# Patient Record
Sex: Male | Born: 1990
Health system: Southern US, Community
[De-identification: ages and names within clinical notes are randomized; demographics above are authoritative.]

## PROBLEM LIST (undated history)

## (undated) DIAGNOSIS — E7800 Pure hypercholesterolemia, unspecified: Secondary | ICD-10-CM

---

## 2016-08-17 ENCOUNTER — Emergency Department (HOSPITAL_COMMUNITY): Admission: EM | Admit: 2016-08-17 | Discharge: 2016-08-17 | Discharge status: Absent without leave | Payer: Self-pay

## 2016-09-11 ENCOUNTER — Emergency Department (HOSPITAL_COMMUNITY): Payer: Self-pay

## 2016-09-11 ENCOUNTER — Encounter (HOSPITAL_COMMUNITY): Payer: Self-pay | Admitting: Emergency Medicine

## 2016-09-11 ENCOUNTER — Emergency Department (HOSPITAL_COMMUNITY)
Admission: EM | Admit: 2016-09-11 | Discharge: 2016-09-12 | Disposition: A | Discharge status: Home / Self Care | Payer: Self-pay | Attending: Emergency Medicine | Admitting: Emergency Medicine

## 2016-09-11 DIAGNOSIS — R05 Cough: Secondary | ICD-10-CM

## 2016-09-11 DIAGNOSIS — R059 Cough, unspecified: Secondary | ICD-10-CM

## 2016-09-11 DIAGNOSIS — R0602 Shortness of breath: Secondary | ICD-10-CM

## 2016-09-11 DIAGNOSIS — J40 Bronchitis, not specified as acute or chronic: Secondary | ICD-10-CM | POA: Insufficient documentation

## 2016-09-11 HISTORY — DX: Pure hypercholesterolemia, unspecified: E78.00

## 2016-09-11 LAB — CBC WITH DIFFERENTIAL/PLATELET
BASOS PCT: 0 %
Basophils Absolute: 0 10*3/uL (ref 0.0–0.1)
EOS ABS: 0.7 10*3/uL (ref 0.0–0.7)
EOS PCT: 12 %
HEMATOCRIT: 42.3 % (ref 39.0–52.0)
Hemoglobin: 14.1 g/dL (ref 13.0–17.0)
Lymphocytes Relative: 34 %
Lymphs Abs: 2.1 10*3/uL (ref 0.7–4.0)
MCH: 28.3 pg (ref 26.0–34.0)
MCHC: 33.3 g/dL (ref 30.0–36.0)
MCV: 84.9 fL (ref 78.0–100.0)
MONO ABS: 0.7 10*3/uL (ref 0.1–1.0)
MONOS PCT: 11 %
Neutro Abs: 2.7 10*3/uL (ref 1.7–7.7)
Neutrophils Relative %: 43 %
Platelets: 209 10*3/uL (ref 150–400)
RBC: 4.98 MIL/uL (ref 4.22–5.81)
RDW: 13 % (ref 11.5–15.5)
WBC: 6.3 10*3/uL (ref 4.0–10.5)

## 2016-09-11 LAB — BASIC METABOLIC PANEL
Anion gap: 5 (ref 5–15)
BUN: 15 mg/dL (ref 6–20)
CALCIUM: 9 mg/dL (ref 8.9–10.3)
CO2: 28 mmol/L (ref 22–32)
CREATININE: 1.03 mg/dL (ref 0.61–1.24)
Chloride: 105 mmol/L (ref 101–111)
GFR calc Af Amer: >60 mL/min (ref >60)
GFR calc non Af Amer: >60 mL/min (ref >60)
GLUCOSE: 101 mg/dL — AB (ref 65–99)
Potassium: 3.6 mmol/L (ref 3.5–5.1)
Sodium: 138 mmol/L (ref 135–145)

## 2016-09-11 MED ORDER — IPRATROPIUM-ALBUTEROL 0.5-2.5 (3) MG/3ML IN SOLN
3.0000 mL | Freq: Once | RESPIRATORY_TRACT | Status: AC
Start: 2016-09-11 — End: 2016-09-11
  Administered 2016-09-11: 3 mL via RESPIRATORY_TRACT
  Filled 2016-09-11: qty 3

## 2016-09-11 MED ORDER — ALBUTEROL SULFATE HFA 108 (90 BASE) MCG/ACT IN AERS
2.0000 | INHALATION_SPRAY | Freq: Four times a day (QID) | RESPIRATORY_TRACT | Status: DC | PRN
  Administered 2016-09-11: 2 via RESPIRATORY_TRACT
  Filled 2016-09-11: qty 6.7

## 2016-09-11 MED ORDER — BENZONATATE 100 MG PO CAPS: 100.0000 mg | ORAL_CAPSULE | Freq: Three times a day (TID) | ORAL | 0 refills | Status: AC | PRN

## 2016-09-11 NOTE — ED Triage Notes (Signed)
Pt states just got over cold recently and is c/o cough. States there was productive cough with yellow sputum but now is a dry cough. States trouble breathing while laying down. Pt speaking in full sentences. Lung sounds are clear. Pt reports taking Nyquil before coming. Pt states trouble while sleeping.

## 2016-09-11 NOTE — ED Provider Notes (Signed)
[facility]-EMERGENCY DEPT Provider Note   CSN: [id number] Arrival date & time: 09/11/16  1914  By signing my name below, I, John Arroyo, attest that this documentation has been prepared under the direction and in the presence of non-physician practitioner, Arvilla Meres, PA-C. Electronically Signed: Majel Arroyo, Scribe. 09/11/2016. 9:02 PM.  History   Chief Complaint Chief Complaint  Patient presents with  . Cough   The history is provided by the patient. No language interpreter was used.   HPI Comments: John Arroyo is a 25 y.o. male with PMHx of HLD, who presents to the Emergency Department complaining of gradually worsening cough and intermittent chest tightness that began 1 week ago. Pt reports he recently had a cold in which he had a productive cough and yellow sputum but states his other symptoms have resolved and he only has a dry cough now. He notes associated intermittent shortness of breath and difficulty sleeping due to his cough and chest tightness. Patient states chest tightness only occurs while laying down and it is what prompted him to come to the ED. Pt reports hx of bronchitis but notes his current symptoms do not feel similar. He states he has taken Nyquil with no relief. Pt denies trauma to his chest wall, wheezing, fever, leg swelling, recent long travel or immobilization, use of hormone therapy, hemoptysis, hx of PE or cancer.   Past Medical History:  Diagnosis Date  . High cholesterol    There are no active problems to display for this patient.  History reviewed. No pertinent surgical history.  Home Medications    Prior to Admission medications   Medication Sig Start Date End Date Taking? Authorizing Provider  benzonatate (TESSALON) 100 MG capsule Take 1 capsule (100 mg total) by mouth 3 (three) times daily as needed for cough. 09/11/16   Lona Kettle, PA-C    Family History No family history on file.  Social History Social History  Substance Use Topics    . Smoking status: Never Smoker  . Smokeless tobacco: Never Used  . Alcohol use No   Allergies   Review of patient's allergies indicates not on file.   Review of Systems Review of Systems  Constitutional: Negative for fever.  HENT: Negative for trouble swallowing.   Eyes: Negative for visual disturbance.  Respiratory: Positive for cough, chest tightness and shortness of breath. Negative for wheezing.   Cardiovascular: Negative for chest pain and leg swelling.  Gastrointestinal: Negative for abdominal pain, nausea and vomiting.  Genitourinary: Negative for dysuria and hematuria.  Skin: Negative for rash.  Allergic/Immunologic: Negative for immunocompromised state.  Neurological: Negative for headaches.   Physical Exam Updated Vital Signs BP 121/72 (BP Location: Left Arm)   Pulse 86   Temp 98.8 F (37.1 C) (Oral)   Resp 15   Ht 5\' 10"  (1.778 m)   Wt 169 lb 4.8 oz (76.8 kg)   SpO2 99%   BMI 24.29 kg/m   Physical Exam  Constitutional: He appears well-developed and well-nourished. No distress.  HENT:  Head: Normocephalic and atraumatic.  Mouth/Throat: Oropharynx is clear and moist. No oropharyngeal exudate.  Eyes: Conjunctivae and EOM are normal. Pupils are equal, round, and reactive to light. Right eye exhibits no discharge. Left eye exhibits no discharge. No scleral icterus.  Neck: Normal range of motion and phonation normal. Neck supple. No neck rigidity. Normal range of motion present.  Cardiovascular: Normal rate, regular rhythm, normal heart sounds and intact distal pulses.   No murmur heard. Pulmonary/Chest: Effort  normal and breath sounds normal. No stridor. No respiratory distress. He has no wheezes. He has no rales. He exhibits no tenderness.  Lungs clear to auscultation bilaterally, no tenderness to chest wall, chest expansion symmetric. No orthopnea.   Abdominal: Soft. Bowel sounds are normal. He exhibits no distension. There is no tenderness. There is no  rigidity, no rebound, no guarding and no CVA tenderness.  Musculoskeletal: Normal range of motion. He exhibits no edema.  Lymphadenopathy:    He has no cervical adenopathy.  Neurological: He is alert. He is not disoriented. Coordination and gait normal. GCS eye subscore is 4. GCS verbal subscore is 5. GCS motor subscore is 6.  Skin: Skin is warm and dry. He is not diaphoretic.  Psychiatric: He has a normal mood and affect. His behavior is normal.   ED Treatments / Results  Labs (all labs ordered are listed, but only abnormal results are displayed) Labs Reviewed  BASIC METABOLIC PANEL - Abnormal; Notable for the following:       Result Value   Glucose, Bld 101 (*)    All other components within normal limits  CBC WITH DIFFERENTIAL/PLATELET  HIV ANTIBODY (ROUTINE TESTING)  TROPONIN I  I-STAT TROPOININ, ED    EKG  EKG Interpretation  Date/Time:  Tuesday September 11 2016 22:42:33 EDT Ventricular Rate:  66 PR Interval:    QRS Duration: 95 QT Interval:  377 QTC Calculation: 395 R Axis:   47 Text Interpretation:  Sinus rhythm No STEMI Confirmed by LONG MD, JOSHUA ([id number]) on 09/12/2016 4:07:05 PM       Radiology No results found.  Procedures Procedures (including critical care time)  Medications Ordered in ED Medications  ipratropium-albuterol (DUONEB) 0.5-2.5 (3) MG/3ML nebulizer solution 3 mL (3 mLs Nebulization Given 09/11/16 2215)    DIAGNOSTIC STUDIES:  Oxygen Saturation is 99% on RA, normal by my interpretation.    COORDINATION OF CARE:  9:01 PM Discussed treatment plan with pt at bedside and pt agreed to plan.  Initial Impression / Assessment and Plan / ED Course  I have reviewed the triage vital signs and the nursing notes.  Pertinent labs & imaging results that were available during my care of the patient were reviewed by me and considered in my medical decision making (see chart for details).  Clinical Course  Value Comment By Time  DG Chest 2 View  Normal cardiac silhouette. No evidence of consolidation, effusion, or PTX. No free air under diaphragm.  Lona Kettleshley Laurel Meyer, [place]PA-C 10/31 2110    Patient presents to ED with complaint of cough, chest tightness, and shortness of breath x 1 week. Patient is afebrile and non-toxic appearing in NAD. VSS. Respirations are even and unlabored. No hypoxia. Chest wall expansion symmetric. Lungs are clear to auscultation without wheezing or rales. No orthopnea. Heart RRR. No lower extremity swelling. Breathing treatment given. Provided pt's concern regarding heart will check labs, EKG, and CXR. Patient is also requesting HIV testing.   EKG shows sinus rhythm. Troponin negative. Low suspicion of ACS. Labs normal. CXR nml - no PNA, pleural effusion, pr PTX. Well's score 0, PERC negative, low suspicion for PE. Suspect may be ?bronchitis vs. ?asthma. Discussed results and plan with patient. Inhaler provided. Rx tessalon perles for cough relief. Encouraged follow up with PCP. Return precautions given. Pt voiced understanding and is agreeable.   I personally performed the services described in this documentation, which was scribed in my presence. The recorded information has been reviewed and is accurate.  Final Clinical Impressions(s) / ED Diagnoses   Final diagnoses:  Cough  Bronchitis  Shortness of breath    New Prescriptions Discharge Medication List as of 09/11/2016 11:33 PM    START taking these medications   Details  benzonatate (TESSALON) 100 MG capsule Take 1 capsule (100 mg total) by mouth 3 (three) times daily as needed for cough., Starting Tue 09/11/2016, Print         Lona KettleAshley Laurel Meyer, PA-C 09/14/16 2047    Melene Planan Floyd, DO 09/16/16 [phone]

## 2016-09-11 NOTE — Discharge Instructions (Signed)
Read the information below.  Your x-ray, EKG, and labs are re-assuring. The HIV test will not come back today, you will be notified if abnormal.  You may have bronchitis. You are being provided an inhaler. Use as needed for shortness of breath.  I have prescribed tessalon perles for cough relief.  You can take tylenol 650mg  every 6hrs or motrin 400mg  every 6hrs for pain relief.  It is important to follow up with your primary provider. Please call to schedule an appointment.  You may return to the Emergency Department at any time for worsening condition or any new symptoms that concern you. Return to ED if develop worsening shortness of breath, chest pain, dizziness, lightheadedness, lower leg swelling, coughing up blood, or any other new/concerning symptoms.

## 2016-09-12 LAB — TROPONIN I

## 2016-09-12 LAB — HIV ANTIBODY (ROUTINE TESTING W REFLEX): HIV SCREEN 4TH GENERATION: NONREACTIVE

## 2016-09-17 ENCOUNTER — Emergency Department (HOSPITAL_COMMUNITY)
Admission: EM | Admit: 2016-09-17 | Discharge: 2016-09-18 | Disposition: A | Discharge status: Home / Self Care | Payer: 59 | Attending: Emergency Medicine | Admitting: Emergency Medicine

## 2016-09-17 ENCOUNTER — Encounter (HOSPITAL_COMMUNITY): Payer: Self-pay | Admitting: Emergency Medicine

## 2016-09-17 DIAGNOSIS — R197 Diarrhea, unspecified: Secondary | ICD-10-CM | POA: Insufficient documentation

## 2016-09-17 LAB — CBC
HEMATOCRIT: 43.3 % (ref 39.0–52.0)
HEMOGLOBIN: 14.4 g/dL (ref 13.0–17.0)
MCH: 28.6 pg (ref 26.0–34.0)
MCHC: 33.3 g/dL (ref 30.0–36.0)
MCV: 86.1 fL (ref 78.0–100.0)
PLATELETS: 241 10*3/uL (ref 150–400)
RBC: 5.03 MIL/uL (ref 4.22–5.81)
RDW: 13.2 % (ref 11.5–15.5)
WBC: 5.6 10*3/uL (ref 4.0–10.5)

## 2016-09-17 LAB — COMPREHENSIVE METABOLIC PANEL
ALT: 20 U/L (ref 17–63)
ANION GAP: 7 (ref 5–15)
AST: 18 U/L (ref 15–41)
Albumin: 4.2 g/dL (ref 3.5–5.0)
Alkaline Phosphatase: 55 U/L (ref 38–126)
BUN: 14 mg/dL (ref 6–20)
CHLORIDE: 105 mmol/L (ref 101–111)
CO2: 28 mmol/L (ref 22–32)
CREATININE: 1.04 mg/dL (ref 0.61–1.24)
Calcium: 9.2 mg/dL (ref 8.9–10.3)
Glucose, Bld: 103 mg/dL — ABNORMAL HIGH (ref 65–99)
POTASSIUM: 3.7 mmol/L (ref 3.5–5.1)
SODIUM: 140 mmol/L (ref 135–145)
Total Bilirubin: 1.5 mg/dL — ABNORMAL HIGH (ref 0.3–1.2)
Total Protein: 7.4 g/dL (ref 6.5–8.1)

## 2016-09-17 LAB — URINALYSIS, ROUTINE W REFLEX MICROSCOPIC
Bilirubin Urine: NEGATIVE
GLUCOSE, UA: NEGATIVE mg/dL
HGB URINE DIPSTICK: NEGATIVE
Ketones, ur: NEGATIVE mg/dL
LEUKOCYTES UA: NEGATIVE
Nitrite: NEGATIVE
PH: 6 (ref 5.0–8.0)
PROTEIN: NEGATIVE mg/dL
SPECIFIC GRAVITY, URINE: 1.034 — AB (ref 1.005–1.030)

## 2016-09-17 LAB — LIPASE, BLOOD: LIPASE: 21 U/L (ref 11–51)

## 2016-09-17 MED ORDER — ONDANSETRON 8 MG PO TBDP
8.0000 mg | ORAL_TABLET | Freq: Once | ORAL | Status: AC
  Administered 2016-09-17: 8 mg via ORAL
  Filled 2016-09-17: qty 1

## 2016-09-17 MED ORDER — SODIUM CHLORIDE 0.9 % IV BOLUS (SEPSIS)
1000.0000 mL | Freq: Once | INTRAVENOUS | Status: AC
  Administered 2016-09-17: 1000 mL via INTRAVENOUS

## 2016-09-17 MED ORDER — LOPERAMIDE HCL 2 MG PO CAPS
4.0000 mg | ORAL_CAPSULE | Freq: Once | ORAL | Status: AC
  Administered 2016-09-17: 4 mg via ORAL
  Filled 2016-09-17: qty 2

## 2016-09-17 NOTE — ED Notes (Signed)
Provider in room  

## 2016-09-17 NOTE — ED Triage Notes (Addendum)
Pt reports dizziness and lightheaded x week . Reports hit on head at work. Denies blurry vision, headache, nor Hx ear illness. Alert and oriented x4. No further symptoms.  Pt added that he has been having diarrhea x 1 week . Nausea and emesis x 1.

## 2016-09-17 NOTE — ED Provider Notes (Signed)
[facility]-EMERGENCY DEPT Provider Note   CSN: [id number] Arrival date & time: 09/17/16  1611 By signing my name below, I, Levon HedgerElizabeth Hall, attest that this documentation has been prepared under the direction and in the presence of non-physician practitioner, Arvilla MeresAshley Danh Bayus, PA-C.  Electronically Signed: Levon HedgerElizabeth Hall, Scribe. 09/17/2016. 10:30 PM.   History   Chief Complaint Chief Complaint  Patient presents with  . Dizziness  . Diarrhea   HPI Jola BaptistLeonard Huwe is a 25 y.o. male who presents to the Emergency Department complaining of intermittent, frequent diarrhea which began one week ago. He states his stool is watery and brown in color. Per pt, he is having episodes of diarrhea every 2 hours. No alleviating or modifying factors noted.  Pt is able to eat and drink normally. No one at home has similar symptoms. He denies any recent stress or foreign travel. Pt denies any hematochezia, abdominal pain or cramping, dysuria, hematuria, penile pain, penile discharge, fever, double vision, or blurred vision.  Pt notes associated lightheadedness after standing up, vomiting x 1 episode, nausea, and generalized weakness. He was seen last week for bronchitis and states his SOB has improved. He is not currently followed by a PCP but has an appointment with one on 09/26/16. No treatments tried PTA.    The history is provided by the patient. No language interpreter was used.   Past Medical History:  Diagnosis Date  . High cholesterol    There are no active problems to display for this patient.  History reviewed. No pertinent surgical history.   Home Medications    Prior to Admission medications   Medication Sig Start Date End Date Taking? Authorizing Provider  albuterol (PROVENTIL HFA;VENTOLIN HFA) 108 (90 Base) MCG/ACT inhaler Inhale 2 puffs into the lungs every 6 (six) hours as needed for wheezing or shortness of breath.   Yes Historical Provider, MD  benzonatate (TESSALON) 100 MG capsule Take 1 capsule  (100 mg total) by mouth 3 (three) times daily as needed for cough. 09/11/16  Yes Lona KettleAshley Laurel Chan Rosasco, PA-C  loperamide (IMODIUM) 2 MG capsule Take 1 capsule (2 mg total) by mouth 4 (four) times daily as needed for diarrhea or loose stools. 09/17/16   Lona KettleAshley Laurel Kailea Dannemiller, PA-C   Family History No family history on file.  Social History Social History  Substance Use Topics  . Smoking status: Never Smoker  . Smokeless tobacco: Never Used  . Alcohol use No   Allergies   Patient has no known allergies.  Review of Systems Review of Systems  Constitutional: Positive for fatigue (generalized). Negative for chills and fever.  HENT: Negative for trouble swallowing.   Eyes: Negative for visual disturbance.  Respiratory: Negative for shortness of breath.   Cardiovascular: Negative for chest pain.  Gastrointestinal: Positive for diarrhea, nausea and vomiting ( x 1). Negative for abdominal pain, blood in stool and constipation.  Genitourinary: Negative for discharge, dysuria, hematuria, penile pain and testicular pain.  Musculoskeletal: Negative for myalgias and neck pain.  Skin: Negative for rash.  Neurological: Positive for light-headedness. Negative for syncope and headaches.   Physical Exam Updated Vital Signs BP 118/72 (BP Location: Right Arm)   Pulse 67   Temp 98 F (36.7 C) (Oral)   Resp 18   Ht 5\' 10"  (1.778 m)   Wt 90.7 kg   SpO2 99%   BMI 28.70 kg/m   Physical Exam  Constitutional: He appears well-developed and well-nourished. No distress.  HENT:  Head: Normocephalic and atraumatic.  Mouth/Throat: Oropharynx  is clear and moist. No oropharyngeal exudate.  Eyes: Conjunctivae and EOM are normal. Pupils are equal, round, and reactive to light. Right eye exhibits no discharge. Left eye exhibits no discharge. No scleral icterus.  Neck: Normal range of motion and phonation normal. Neck supple. No neck rigidity. Normal range of motion present.  Cardiovascular: Normal rate, regular  rhythm, normal heart sounds and intact distal pulses.   No murmur heard. Pulmonary/Chest: Effort normal and breath sounds normal. No stridor. No respiratory distress. He has no wheezes. He has no rales.  Abdominal: Soft. Bowel sounds are normal. He exhibits no distension. There is no tenderness. There is no rigidity, no rebound, no guarding and no CVA tenderness.  Musculoskeletal: Normal range of motion.  Lymphadenopathy:    He has no cervical adenopathy.  Neurological: He is alert. He is not disoriented. Coordination and gait normal. GCS eye subscore is 4. GCS verbal subscore is 5. GCS motor subscore is 6.  Skin: Skin is warm and dry. He is not diaphoretic.  Psychiatric: He has a normal mood and affect. His behavior is normal.   ED Treatments / Results  DIAGNOSTIC STUDIES:  Oxygen Saturation is 99% on RA, normal by my interpretation.    COORDINATION OF CARE:  10:26 PM Discussed treatment plan with pt at bedside and pt agreed to plan.   Labs (all labs ordered are listed, but only abnormal results are displayed) Labs Reviewed  COMPREHENSIVE METABOLIC PANEL - Abnormal; Notable for the following:       Result Value   Glucose, Bld 103 (*)    Total Bilirubin 1.5 (*)    All other components within normal limits  URINALYSIS, ROUTINE W REFLEX MICROSCOPIC (NOT AT [facility]) - Abnormal; Notable for the following:    Specific Gravity, Urine 1.034 (*)    All other components within normal limits  LIPASE, BLOOD  CBC    EKG  EKG Interpretation None       Radiology No results found.  Procedures Procedures (including critical care time)  Medications Ordered in ED Medications  sodium chloride 0.9 % bolus 1,000 mL (0 mLs Intravenous Stopped 09/17/16 2318)  loperamide (IMODIUM) capsule 4 mg (4 mg Oral Given 09/17/16 2317)  ondansetron (ZOFRAN-ODT) disintegrating tablet 8 mg (8 mg Oral Given 09/17/16 2318)   Initial Impression / Assessment and Plan / ED Course  I have reviewed the triage  vital signs and the nursing notes.  Pertinent labs & imaging results that were available during my care of the patient were reviewed by me and considered in my medical decision making (see chart for details).  Clinical Course     Patient presents to ED with complaint of non-bloody diarrhea x 1 week with one episode of emesis and associated generalized fatigue and lightheadedness. Patient is afebrile and non-toxic appearing in NAD. VSS. Lungs CTABL. Heart RRR. Abdomen +BS, soft, non-distended, non-tender. Labs grossly normal. Pt not orthostatic. IVF and anti-emetic given.   Patient tolerating PO fluids. Patient ambulated without difficulty. Suspect acute infectious diarrhea. Patient is afebrile, no leukocytosis, and non-bloody stool. Do not feel empiric ABX warranted at this time. Discussed results and plan with patient. Rx Immodium. Can take probiotics. Stay well hydrated. Follow up with PCP for re-evaluation. Return precautions given. Patient voiced understanding and is agreeable.    Final Clinical Impressions(s) / ED Diagnoses   Final diagnoses:  Diarrhea, unspecified type    New Prescriptions New Prescriptions   LOPERAMIDE (IMODIUM) 2 MG CAPSULE    Take 1  capsule (2 mg total) by mouth 4 (four) times daily as needed for diarrhea or loose stools.  I personally performed the services described in this documentation, which was scribed in my presence. The recorded information has been reviewed and is accurate.    Lona KettleAshley Laurel Haydn Cush, PA-C 09/18/16 0038    Pricilla LovelessScott Goldston, MD 09/19/16 1620

## 2016-09-18 MED ORDER — LOPERAMIDE HCL 2 MG PO CAPS: 2.0000 mg | ORAL_CAPSULE | Freq: Four times a day (QID) | ORAL | 0 refills | Status: AC | PRN

## 2016-09-18 NOTE — Discharge Instructions (Signed)
Read the information below.  Your labs were re-assuring. Continue to stay well hydrated. I have prescribed loperamide you can take 1 tab as needed up to four times a day for diarrhea.  Be sure to keep your schedule appointment with your primary provider.  Use the prescribed medication as directed.  Please discuss all new medications with your pharmacist.   You may return to the Emergency Department at any time for worsening condition or any new symptoms that concern you. Return to ED if you develop abdominal pain, fever, blood in stool, inability to keep food/fluid down, or any other new/concerning symptoms.

## 2016-09-18 NOTE — ED Notes (Signed)
Fluid challenge successful  ambulated in hall no problem

## 2018-01-29 ENCOUNTER — Emergency Department (HOSPITAL_COMMUNITY)
Admission: EM | Admit: 2018-01-29 | Discharge: 2018-01-29 | Disposition: A | Discharge status: Home / Self Care | Payer: 59 | Attending: Emergency Medicine | Admitting: Emergency Medicine

## 2018-01-29 ENCOUNTER — Encounter (HOSPITAL_COMMUNITY): Payer: Self-pay | Admitting: Emergency Medicine

## 2018-01-29 ENCOUNTER — Other Ambulatory Visit: Payer: Self-pay

## 2018-01-29 ENCOUNTER — Emergency Department (HOSPITAL_COMMUNITY): Payer: 59

## 2018-01-29 DIAGNOSIS — R0981 Nasal congestion: Secondary | ICD-10-CM | POA: Insufficient documentation

## 2018-01-29 DIAGNOSIS — J029 Acute pharyngitis, unspecified: Secondary | ICD-10-CM | POA: Diagnosis not present

## 2018-01-29 DIAGNOSIS — R05 Cough: Secondary | ICD-10-CM | POA: Diagnosis present

## 2018-01-29 DIAGNOSIS — J069 Acute upper respiratory infection, unspecified: Secondary | ICD-10-CM

## 2018-01-29 DIAGNOSIS — B9789 Other viral agents as the cause of diseases classified elsewhere: Secondary | ICD-10-CM | POA: Diagnosis not present

## 2018-01-29 DIAGNOSIS — R0789 Other chest pain: Secondary | ICD-10-CM | POA: Insufficient documentation

## 2018-01-29 MED ORDER — GUAIFENESIN-CODEINE 100-10 MG/5ML PO SYRP: 5.0000 mL | ORAL_SOLUTION | Freq: Three times a day (TID) | ORAL | 0 refills | Status: AC | PRN

## 2018-01-29 MED ORDER — BENZONATATE 100 MG PO CAPS: 100.0000 mg | ORAL_CAPSULE | Freq: Three times a day (TID) | ORAL | 0 refills | Status: AC

## 2018-01-29 NOTE — Discharge Instructions (Signed)
Your chest x-ray and EKG were reassuring.  I have written you a prescription for a cough medicine.  This medicine can make you drowsy so please do not drive or work while taking it.  Please use warm liquids, throat lozenges and salt water gargles to help with your sore throat.  Return to the emergency department if you have trouble breathing, fever >100.22F or have new or concerning symptoms.

## 2018-01-29 NOTE — ED Provider Notes (Signed)
[facility]-EMERGENCY DEPT Provider Note   CSN: [id number] Arrival date & time: 01/29/18  0334     History   Chief Complaint Chief Complaint  Patient presents with  . Cough    HPI John Arroyo is a 27 y.o. male.  HPI  John Arroyo is a 27 year old male with a history of hyperlipidemia who presents to the emergency department for evaluation of productive cough, congestion, sore throat and feelings of chest tightness.  Patient reports that he developed a productive cough with thick yellow sputum about four days ago.  Reports that he also has associated "scratchy" throat which is worsened with coughing heavily.  He states that yesterday evening he felt bilateral anterior "chest tightness", feeling as if he has mucus "filling my lungs."  Chest tightness becomes worse when he moves, with palpation over the anterior chest wall or with taking a deep breath.  Denies chest pain.  Also reports congestion with a thick yellow mucus after blowing his nose. He denies history of asthma or lung disease.  He has tried taking over-the-counter NyQuil and Robitussin for his symptoms without relief. He denies fever, chills, headache, body aches, dysphagia, ear pain, shortness of breath, abdominal pain, n/v, dysuria. He denies history of DVT/PE, shortness of breath, leg swelling, calf pain, recent surgery or immobilization, exogenous estrogen.   Past Medical History:  Diagnosis Date  . High cholesterol     There are no active problems to display for this patient.   History reviewed. No pertinent surgical history.     Home Medications    Prior to Admission medications   Medication Sig Start Date End Date Taking? Authorizing Provider  benzonatate (TESSALON) 100 MG capsule Take 1 capsule (100 mg total) by mouth 3 (three) times daily as needed for cough. Patient not taking: Reported on 01/29/2018 09/11/16   Deborha Payment, PA-C  loperamide (IMODIUM) 2 MG capsule Take 1 capsule (2  mg total) by mouth 4 (four) times daily as needed for diarrhea or loose stools. Patient not taking: Reported on 01/29/2018 09/17/16   Deborha Payment, PA-C    Family History Family History  Problem Relation Age of Onset  . Hypertension Father   . High Cholesterol Father     Social History Social History   Tobacco Use  . Smoking status: Never Smoker  . Smokeless tobacco: Never Used  Substance Use Topics  . Alcohol use: No  . Drug use: No     Allergies   Patient has no known allergies.   Review of Systems Review of Systems  Constitutional: Negative for chills and fever.  HENT: Positive for congestion, postnasal drip, rhinorrhea and sore throat. Negative for ear pain, sinus pain, trouble swallowing and voice change.   Eyes: Negative for visual disturbance.  Respiratory: Positive for cough and chest tightness. Negative for shortness of breath and wheezing.   Cardiovascular: Negative for chest pain.  Gastrointestinal: Negative for abdominal pain, diarrhea, nausea and vomiting.  Genitourinary: Negative for dysuria.  Musculoskeletal: Negative for gait problem and myalgias.  Skin: Negative for rash.  Neurological: Negative for headaches.  Psychiatric/Behavioral: Negative for agitation.     Physical Exam Updated Vital Signs BP 129/78   Pulse 74   Temp 98.7 F (37.1 C) (Oral)   Resp 16   SpO2 97%   Physical Exam  Constitutional: He is oriented to person, place, and time. He appears well-developed and well-nourished. No distress.  HENT:  Head: Normocephalic and atraumatic.  Mucous membranes moist.  Postnasal drip present.  No erythema over the posterior oropharynx.  No tonsillar edema or exudate.  Uvula midline.  Airway patent and able to handle oral secretions.  No tenderness over the maxillary or frontal sinuses.  Eyes: Conjunctivae are normal. Pupils are equal, round, and reactive to light. Right eye exhibits no discharge. Left eye exhibits no discharge.  Neck: Normal  range of motion. Neck supple. No JVD present. No tracheal deviation present.  Tender bilateral anterior chain cervical lymphadenopathy.  Cardiovascular: Normal rate, regular rhythm and intact distal pulses. Exam reveals no friction rub.  No murmur heard. Pulmonary/Chest: Effort normal and breath sounds normal. No stridor. No respiratory distress. He has no wheezes. He has no rales.  Tender to palpation over bilateral anterior chest wall.  No overlying bruise or ecchymosis.  Abdominal: Soft. Bowel sounds are normal. There is no tenderness. There is no guarding.  Musculoskeletal: Normal range of motion.  No leg swelling or calf tenderness.   Neurological: He is alert and oriented to person, place, and time. Coordination normal.  Skin: Skin is warm and dry. He is not diaphoretic.  Psychiatric: He has a normal mood and affect. His behavior is normal.  Nursing note and vitals reviewed.    ED Treatments / Results  Labs (all labs ordered are listed, but only abnormal results are displayed) Labs Reviewed - No data to display  EKG  EKG Interpretation  Date/Time:  Wednesday January 29 2018 03:44:53 EDT Ventricular Rate:  96 PR Interval:    QRS Duration: 84 QT Interval:  340 QTC Calculation: 430 R Axis:   43 Text Interpretation:  Sinus rhythm Nonspecific repol abnormality, inferior leads Borderline ST elevation, anterolateral leads Confirmed by Lorre Nick ([id number]) on 01/29/2018 11:09:56 AM       Radiology Dg Chest 2 View  Result Date: 01/29/2018 CLINICAL DATA:  Cough and chest pain EXAM: CHEST - 2 VIEW COMPARISON:  September 11, 2016 FINDINGS: Lungs are clear. Heart size and pulmonary vascularity are normal. No adenopathy. No pneumothorax. No bone lesions. IMPRESSION: No edema or consolidation. Electronically Signed   By: Bretta Bang III M.D.   On: 01/29/2018 09:50    Procedures Procedures (including critical care time)  Medications Ordered in ED Medications - No data to  display   Initial Impression / Assessment and Plan / ED Course  I have reviewed the triage vital signs and the nursing notes.  Pertinent labs & imaging results that were available during my care of the patient were reviewed by me and considered in my medical decision making (see chart for details).    Pt CXR negative for acute infiltrate. Lungs CTA. Patients symptoms are consistent with URI, likely viral etiology. Discussed that antibiotics are not indicated for viral infections. Patient also complaining of "chest tightness." On exam he is tender to palpation over the chest wall. Feel that this is musculoskeletal strain over the chest due to recent cough given worse with movement and palpation over the chest.  Do not suspect ACS, EKG without ischemic changes. Do not suspect PE given no tachycardia, tachypnea, shortness of breath, leg swelling/calf tenderness, recent surgery or immobilization, history of cancer, exogenous hormone use. Pt will be discharged with symptomatic treatment for URI.  He verbalizes understanding and is agreeable with plan. Pt is hemodynamically stable & in NAD prior to dc.  Final Clinical Impressions(s) / ED Diagnoses   Final diagnoses:  Viral URI with cough    ED Discharge Orders  Ordered    guaiFENesin-codeine (ROBITUSSIN AC) 100-10 MG/5ML syrup  3 times daily PRN     01/29/18 1126    benzonatate (TESSALON) 100 MG capsule  Every 8 hours     01/29/18 1126       Lawrence MarseillesShrosbree, Izza Bickle J, PA-C 01/29/18 1126    Lorre NickAllen, Anthony, MD 01/30/18 [phone]

## 2018-01-29 NOTE — ED Triage Notes (Signed)
Pt states he has some chest discomfort and it hurts to breathe in and it hurts when he burps  Pt also states he has a dry cough  Onset of sxs was yesterday

## 2019-05-31 IMAGING — CR DG CHEST 2V
2 series · 2 of 2 positions shown · non-contrast
Comparison: September 11, 2016

CLINICAL DATA: Cough and chest pain

EXAM:
CHEST - 2 VIEW

[w chest pa]
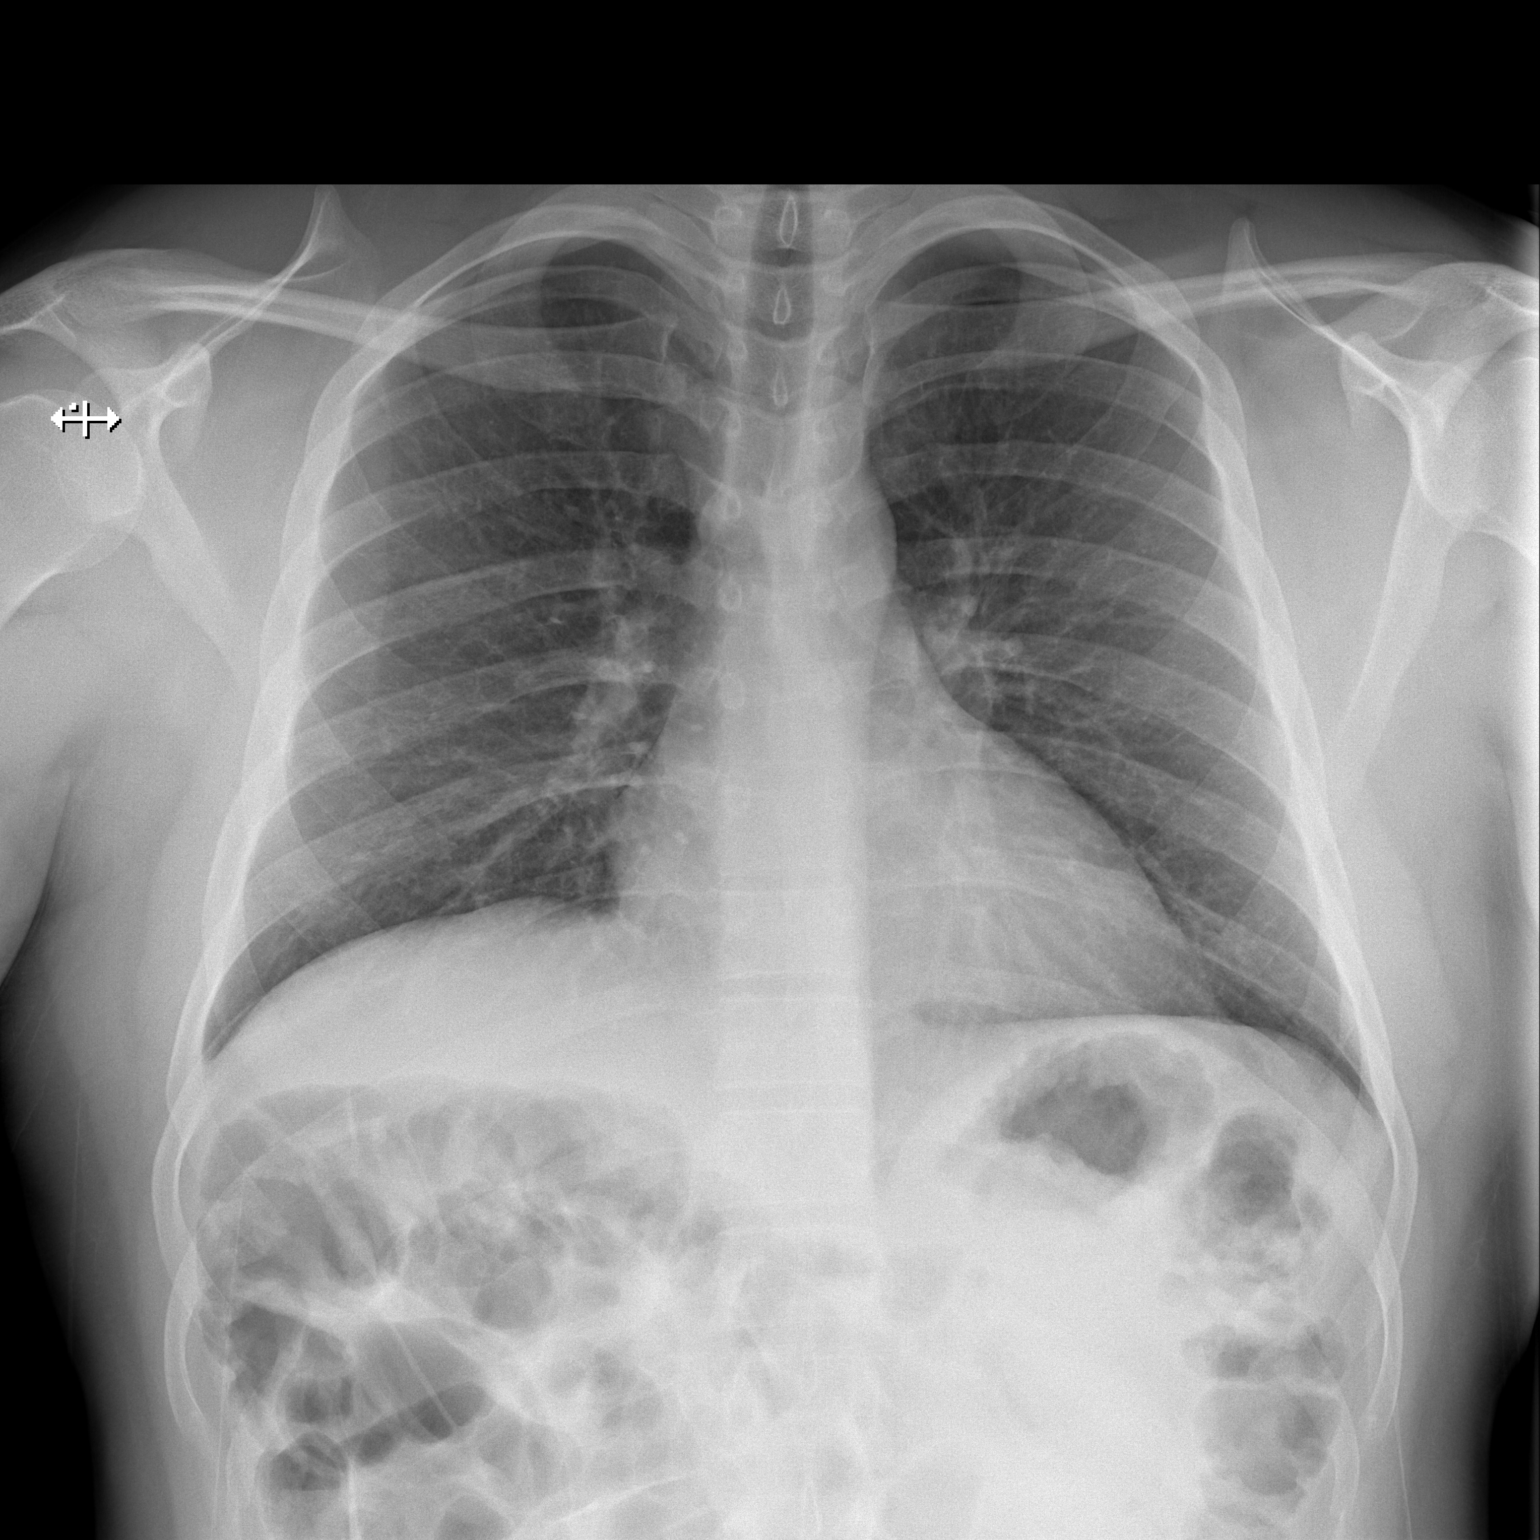

[w chest lat]
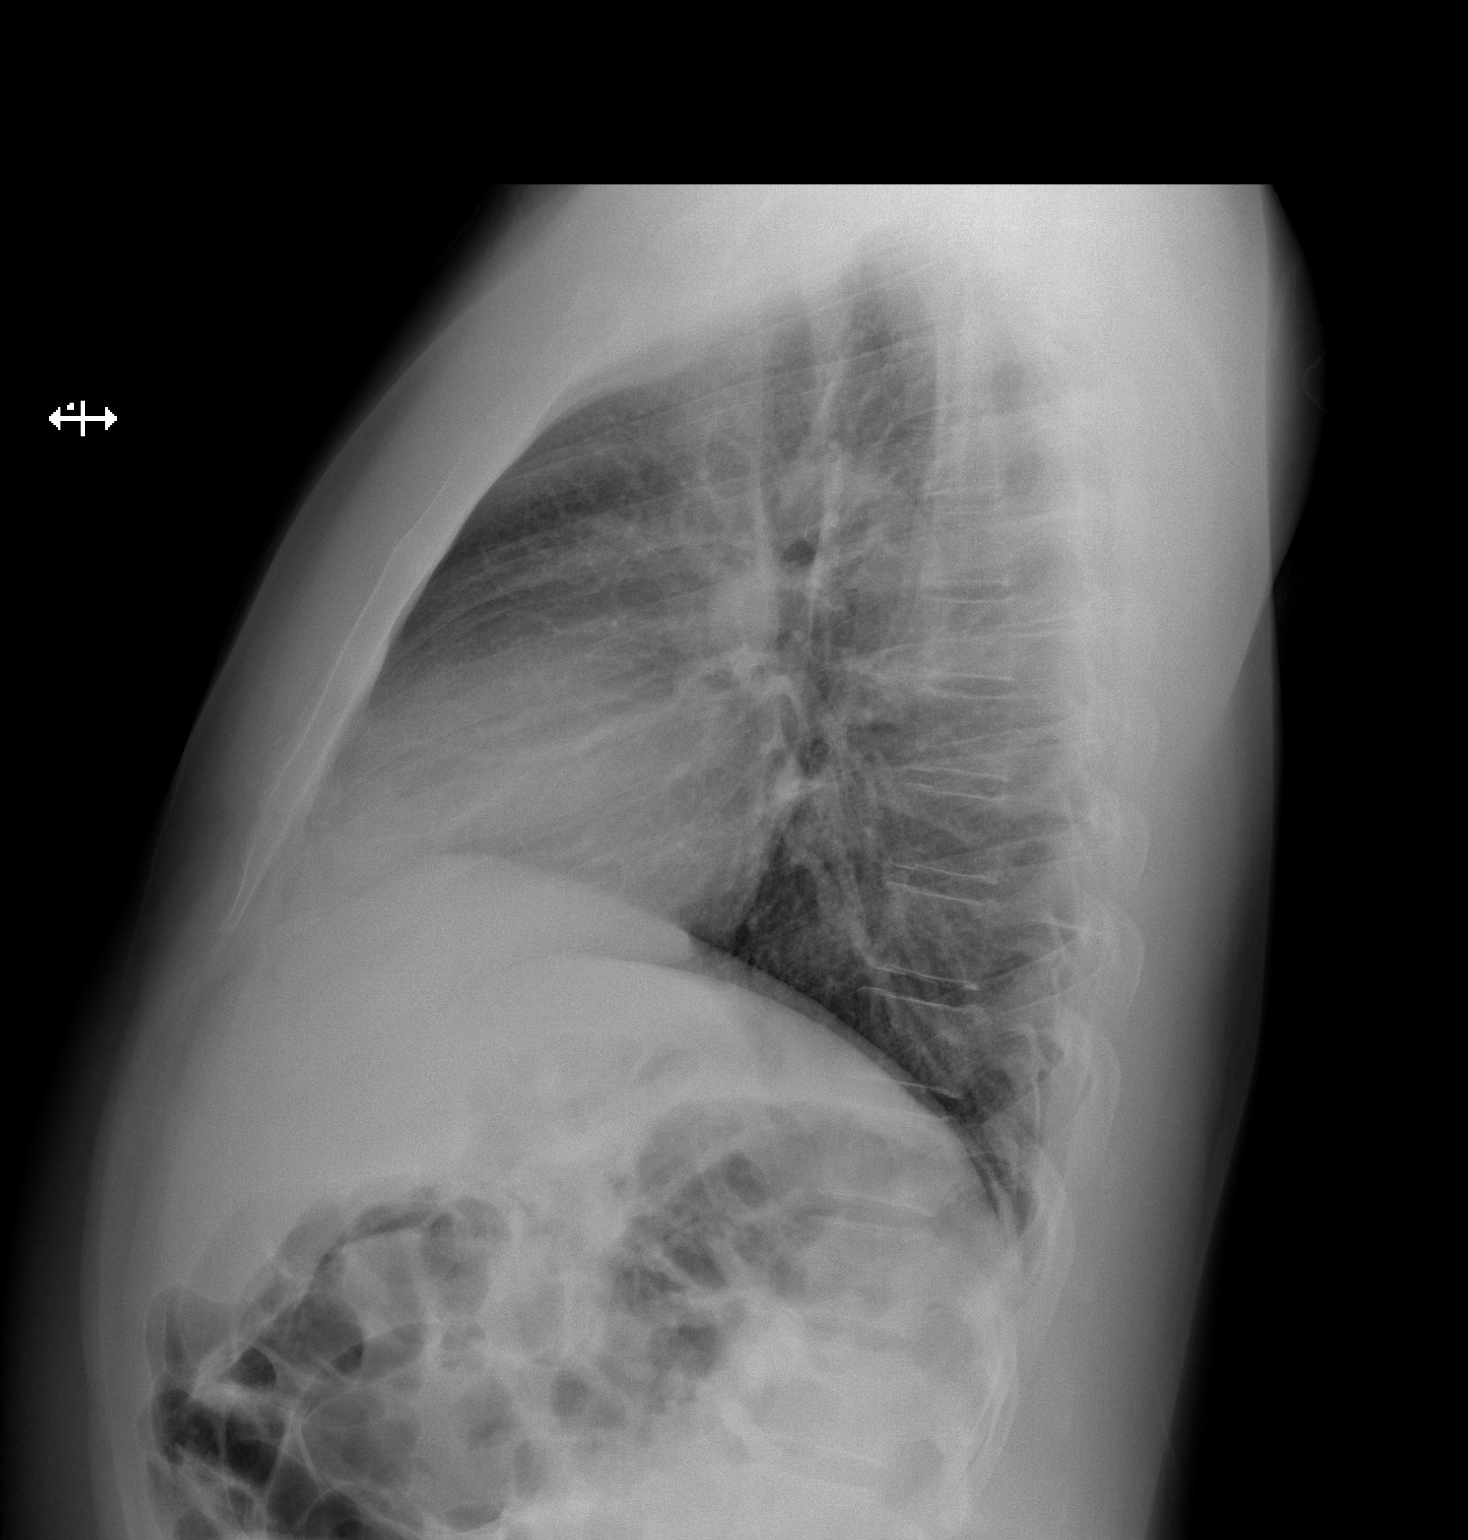

[2 of 2 positions shown; findings below may reference images not displayed]

FINDINGS: Lungs are clear. Heart size and pulmonary vascularity are normal. No
adenopathy. No pneumothorax. No bone lesions.
IMPRESSION: No edema or consolidation.
